# Patient Record
Sex: Female | Born: 1995 | Race: White | Hispanic: No | Marital: Single | Smoking: Current every day smoker
Health system: Southern US, Community
[De-identification: ages and names within clinical notes are randomized; demographics above are authoritative.]

## PROBLEM LIST (undated history)

## (undated) DIAGNOSIS — F329 Major depressive disorder, single episode, unspecified: Secondary | ICD-10-CM

## (undated) DIAGNOSIS — F32A Depression, unspecified: Secondary | ICD-10-CM

## (undated) DIAGNOSIS — F419 Anxiety disorder, unspecified: Secondary | ICD-10-CM

## (undated) HISTORY — DX: Major depressive disorder, single episode, unspecified: F32.9

## (undated) HISTORY — DX: Anxiety disorder, unspecified: F41.9

## (undated) HISTORY — DX: Depression, unspecified: F32.A

---

## 2014-01-03 ENCOUNTER — Encounter (HOSPITAL_COMMUNITY): Payer: Self-pay | Admitting: Professional Counselor

## 2014-01-03 ENCOUNTER — Ambulatory Visit (INDEPENDENT_AMBULATORY_CARE_PROVIDER_SITE_OTHER): Payer: BC Managed Care – PPO | Admitting: Professional Counselor

## 2014-01-03 ENCOUNTER — Encounter (INDEPENDENT_AMBULATORY_CARE_PROVIDER_SITE_OTHER): Payer: Self-pay

## 2014-01-03 DIAGNOSIS — F411 Generalized anxiety disorder: Secondary | ICD-10-CM | POA: Insufficient documentation

## 2014-01-03 DIAGNOSIS — F331 Major depressive disorder, recurrent, moderate: Secondary | ICD-10-CM

## 2014-01-03 NOTE — Progress Notes (Signed)
Patient:   Natalie Kirk   DOB:   Mar 05, 1996  MR Number:  [id number]  [facility]  [facility] [address] [place] [address] Dept: [phone]           Date of Service:   01/03/2014   Start Time:   9:00am End Time:   10:00am  Provider/Observer:  Raye SorrowHannah N Raeden Schippers Clinical Social Work       Billing Code/Service: [phone]  Chief Complaint:     Chief Complaint  Patient presents with  . Establish Care  . Anxiety  . Depression    Reason for Service:  Establish Care and being individual counseling   Current Status:  Patient reports she has been feeling more anxious and depressed over the last 2 years with main stressors being work, school, and social anxiety amongst people.  Reliability of Information: Patient was present and reported through self report. Patient father also attended session and reported additional information.    Referral Source:  PCP: Dr. Jeffie PollockJudge   Behavioral Observation: Natalie BignessChelsea Jost  presents as a 18 y.o.-year-old Right Caucasian Female who appeared her stated age. her dress was Appropriate and she was Fairly Groomed and her manners were Appropriate to the situation.  There were not any physical disabilities noted.  she displayed an appropriate level of cooperation and motivation.    Interactions:    Active   Attention:   within normal limits  Memory:   within normal limits  Visuo-spatial:   within normal limits  Speech (Volume):  low  Speech:   soft  Thought Process:  Coherent  Though Content:  WNL  Orientation:   person, place, time/date and situation  Judgment:   Good  Planning:   Poor  Affect:    Anxious  Mood:    Anxious and Depressed  Insight:   Fair  Intelligence:   normal  Marital Status/Living: Patient reports she is currently living half the time with her parents and the other time with her boyfriend. She reports she has been seeing him for the  last 7 months. 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  Substance Use:  No concerns of substance abuse are reported.  No previous history of substance abuse or treatment.  Education:   College  Patient reports she completed high school at [employer], went to [employer] for a semester but was unsuccessful due to transition and anxiety.  Patient currently enrolled at [employer] and is a Medical laboratory scientific officerophomore.   Military History: None  Strengths:  Independent, driven, hardworking, and caring  Weaknesses:  Communication, anger, confidence and self esteem.  Religious Affiliation:  None currently.  Hobbies/Interests:  Working at her job, reading, watching TV, being with her boyfriend or friend.  Patient has never been part of clubs, sporting activities.  Medical History:   Past Medical History  Diagnosis Date  . Anxiety   . Depression         No outpatient encounter prescriptions on file as of 01/03/2014.          Sexual History:   History  Sexual Activity  . Sexual Activity: Not on file    Abuse/Trauma History: Patient reports no history of sexual, emotional, or physical abuse. Reports no past traumatic situations.  Psychiatric History:  This is patient's first formal appointment for therapy and patient has been prescribed Zoloft from PCP  in the past year. Patient reports she had a negative response to the medication as it made her very sleepy and depressed.  Patient was switched to Klonopin back in February of 2015 but only given 10 pills and currently out of medication.  Patient was referred to Psychiatrist for medication management.  Family Med/Psych History:  Family History  Problem Relation Age of Onset  . Anxiety disorder Maternal Aunt   . Depression Maternal Aunt   . Alcohol abuse Cousin   . Drug abuse Cousin     Risk of Suicide/Violence: virtually non-existent Patient reports no SI, plans, or  intent. Father concurred with report and reports all weapons (guns) are locked away with ammunition also locked in a different place. No past history of SI plans or intent.  Impression/DX:  Hallie arrives to her first therapy session with her father to establish care. Patient has never had any formalized therapy in past and arrives very anxious and nervous.  She reports her biggest stressors are her anxiety and depression which have increased and been present in the last 2 years.  She reports transition from high school to college has been difficult with friends separating and losing contact.  She reports socially she struggles in groups of more the 3-4 and when she feels overwhelmed and upset she will become angry and impulsive.  Patient reports her depression has also been problematic because she has been unable to make new friends, engaged in her social life, and isolates at home or alone. She reports she has one best friend and a boyfriend as supports along with her father. She reports her father has always been there for her since childhood as mother was always working and emotionally unavailable.  Patient reports she has panic attacks 2-3 times a week which is a decrease causing her chest pain, chills, tunnel vision, and anger/outbursts.  She reports she is tired of feeling alone and worrying all the time about work, school, friends, and life.  She will benefit from individual counseling utilizing CBT, assertiveness training, and DBT skills.  Clinically patient is observed to be very anxious and interested discussing and learning about anxiety and how it affects her on a daily basis.  She was also referred for a medication appointment per her asking and part of her treatment plan.   Disposition/Plan:  Patient will begin therapy with LCSW every week for anxiety and depression/unspecifided mood disorder.  Patient was also referred to Psychiatrist for medication management to help alleviate anxiety  symptoms.  Diagnosis:    Axis I:  Major Depression, single episode, moderate      Generalized anxiety, R/o panic disorder or social anxiety disorder     Axis II: Deferred                Lashayla Armes, Evlyn Courier, LCSW

## 2014-01-10 ENCOUNTER — Encounter (HOSPITAL_COMMUNITY): Payer: Self-pay | Admitting: Physician Assistant

## 2014-01-10 ENCOUNTER — Ambulatory Visit (INDEPENDENT_AMBULATORY_CARE_PROVIDER_SITE_OTHER): Payer: BC Managed Care – PPO | Admitting: Physician Assistant

## 2014-01-10 VITALS — BP 106/63 | HR 84 | Ht 64.0 in | Wt 144.0 lb

## 2014-01-10 DIAGNOSIS — F411 Generalized anxiety disorder: Secondary | ICD-10-CM

## 2014-01-10 DIAGNOSIS — F331 Major depressive disorder, recurrent, moderate: Secondary | ICD-10-CM

## 2014-01-10 MED ORDER — HYDROXYZINE HCL 10 MG PO TABS: 10.0000 mg | ORAL_TABLET | Freq: Three times a day (TID) | ORAL | Status: DC | PRN

## 2014-01-10 MED ORDER — BUSPIRONE HCL 10 MG PO TABS: ORAL_TABLET | ORAL | Status: DC

## 2014-01-10 NOTE — Progress Notes (Signed)
    [facility] Follow-up Outpatient Visit  Garth BignessChelsea Kirk 02/08/1996  Date: 01/10/2014    Subjective: Natalie BockChelsea is in with her father today to discuss medication for her anxiety and depression which seems out of control for her.   Filed Vitals:   01/10/14 1510  BP: 106/63  Pulse: 84    Mental Status Examination  Appearance: WDWN WF NAD Alert: Yes Attention: good  Cooperative: Yes Eye Contact: Good Speech: soft, clear goal directed Psychomotor Activity: Normal Memory/Concentration: 3/3 at 1 minute and 3/3 at 5 minutes Oriented: situation, day of week and month of year Mood: Anxious and Depressed  Anxiety 7/10, Depression 3/10 Affect: Congruent Thought Processes and Associations: Coherent, Goal Directed, Linear and Logical Fund of Knowledge: Good Thought Content: WNL Insight: Good Judgement: Good Zung Anxiety: 59/100- upper limits of moderate to severe anxiety Zung Depression: 45/80= mild depression. Diagnosis: GAD Moderate                     Depression mild  Treatment Plan:  1. Medication management. 2. Out patient therapy for CBT. 3. Follow up in 2 weeks. 4. Avoid alcohol and drugs. 5.Routine schedule for sleep.   Rona RavensNeil T. Prabhleen Montemayor RPAC 3:40 PM 01/10/2014

## 2014-01-10 NOTE — Patient Instructions (Signed)
1. Set boundaries with family to guard against explosions. 2. Ask for time outs when needed. 3. Communicate with family need to work on school work with out interruption. 4. Avoid all alcohol and drugs. 5. Practice self soothing techniques. 6. Follow up 2 weeks.

## 2014-01-18 ENCOUNTER — Ambulatory Visit (INDEPENDENT_AMBULATORY_CARE_PROVIDER_SITE_OTHER): Payer: BC Managed Care – PPO | Admitting: Professional Counselor

## 2014-01-18 DIAGNOSIS — F411 Generalized anxiety disorder: Secondary | ICD-10-CM

## 2014-01-18 NOTE — Progress Notes (Signed)
   THERAPIST PROGRESS NOTE  Session Time: 2:00-2:40pm  Participation Level: Active  Behavioral Response: CasualAlertAnxious  Type of Therapy: Individual Therapy  Treatment Goals addressed: Anxiety and Communication: within relationships  Interventions: Motivational Interviewing and Assertiveness Training  Summary: Natalie BignessChelsea Kirk is a 18 y.o. female who presents with  Her father again for session. Patient has constrict affect and anxious mood. She sits on the edge of her chair and struggles finding her words to express self to LCSW.  Session focused on completing treatment plan, setting personal goals for treatment plan and identifying stressors within communication and anxiety.  Patient reports over the last few weeks she has been less anxious due to doing more activities and being more social able. She reports she has not been taking her medication consistently and LCSW provided education as to why medication is important. Patient is able to process feelings related to signing up for classes for college and learn coping skills in efforts to decrease procrastination such as a planner, calendar, and support from father.  Father reports no recent stressors.   Suicidal/Homicidal: Nowithout intent/plan  Therapist Response: Patient remains guarded with information she shares and this could be because she will not come back to therapist office without her father. Patient looks for assurance from father when asked questions, struggles making decisions for self showing lack of control or past history of someone controlling her. Patient shows little progress with accountability and identifying reasons for therapy at this time. She is not taking her medication as directed, thus unknown outcomes. Patient has been given homework to begin journaling thought patterns, situations, feelings, and outcomes. Patient also given reading material about how to analyze thinking  In efforts to uncover unhelpful  thoughts.  Patient must show more motivation towards therapy and is encouraged to come back next session without father.  Plan: Return again in 3 weeks.  Diagnosis: Axis I: Generalized Anxiety Disorder    Axis II: Deferred    Raye SorrowCoble, Rinaldo Macqueen N, LCSW 01/18/2014

## 2014-01-23 ENCOUNTER — Encounter (HOSPITAL_COMMUNITY): Payer: Self-pay | Admitting: Professional Counselor

## 2014-01-24 ENCOUNTER — Encounter (HOSPITAL_COMMUNITY): Payer: Self-pay | Admitting: Physician Assistant

## 2014-01-24 ENCOUNTER — Ambulatory Visit (INDEPENDENT_AMBULATORY_CARE_PROVIDER_SITE_OTHER): Payer: BC Managed Care – PPO | Admitting: Physician Assistant

## 2014-01-24 VITALS — BP 125/74 | HR 82 | Ht 64.0 in | Wt 141.5 lb

## 2014-01-24 DIAGNOSIS — F331 Major depressive disorder, recurrent, moderate: Secondary | ICD-10-CM

## 2014-01-24 DIAGNOSIS — F411 Generalized anxiety disorder: Secondary | ICD-10-CM

## 2014-01-24 NOTE — Patient Instructions (Signed)
1. Patient to take 1/2 of 10mg  buspar two times a day starting tonight with a light snack. 2. Call the office if she continues to have nausea and vomiting, dizziness. 3. Follow up in 3 weeks.

## 2014-01-24 NOTE — Progress Notes (Signed)
   [facility] Follow-up Outpatient Visit  Natalie BignessChelsea Kirk [birth year]  Date: 01/24/2014    Subjective:  Patient is in to follow up on her starting Buspar. She took 10mg  and had significant nausea and vomiting, so she did not take any more. She has just recently returned from the beach, and states she is using less THC than before.     Had a dramatic morning due to argument with Ex BF and then missed her appointment after locking her keys in the car. Filed Vitals:   01/24/14 1530  BP: 125/74  Pulse: 82    Mental Status Examination  Appearance:  Neat and clean Alert: Yes Attention: good  Cooperative: Yes Eye Contact: Good Speech: clear and goal directed Psychomotor Activity: Normal Memory/Concentration: fair Oriented: time/date, day of week and month of year Mood: Anxious and Depressed today she rates her anxiety as a 6-7/10 related to an argument with her ex boy friend, she also locked her keys in the car this AM as well. Affect: Appropriate and Congruent Thought Processes and Associations: Coherent, Goal Directed and Intact Fund of Knowledge: Good Thought Content: WNL Insight: Fair Judgement: Fair  Diagnosis: GAD  Treatment Plan:  1. Patient will take 5mg  of Buspar BID with a light snack and see how this works for her. She will follow up as planned. 2. 3 weeks. Rona RavensNeil T. Angelly Spearing RPAC 3:42 PM 01/24/2014

## 2014-02-08 ENCOUNTER — Ambulatory Visit (HOSPITAL_COMMUNITY): Payer: PRIVATE HEALTH INSURANCE | Admitting: Professional Counselor

## 2014-02-12 ENCOUNTER — Ambulatory Visit (HOSPITAL_COMMUNITY): Payer: PRIVATE HEALTH INSURANCE | Admitting: Professional Counselor

## 2014-02-14 ENCOUNTER — Ambulatory Visit (HOSPITAL_COMMUNITY): Payer: PRIVATE HEALTH INSURANCE | Admitting: Physician Assistant

## 2015-01-06 ENCOUNTER — Emergency Department (INDEPENDENT_AMBULATORY_CARE_PROVIDER_SITE_OTHER): Payer: BLUE CROSS/BLUE SHIELD

## 2015-01-06 ENCOUNTER — Encounter: Payer: Self-pay | Admitting: Emergency Medicine

## 2015-01-06 ENCOUNTER — Emergency Department (INDEPENDENT_AMBULATORY_CARE_PROVIDER_SITE_OTHER)
Admission: EM | Admit: 2015-01-06 | Discharge: 2015-01-06 | Disposition: A | Discharge status: Home / Self Care | Payer: BLUE CROSS/BLUE SHIELD | Source: Home / Self Care | Attending: Family Medicine | Admitting: Family Medicine

## 2015-01-06 DIAGNOSIS — S52602A Unspecified fracture of lower end of left ulna, initial encounter for closed fracture: Secondary | ICD-10-CM | POA: Diagnosis not present

## 2015-01-06 DIAGNOSIS — S52502A Unspecified fracture of the lower end of left radius, initial encounter for closed fracture: Secondary | ICD-10-CM

## 2015-01-06 DIAGNOSIS — W010XXA Fall on same level from slipping, tripping and stumbling without subsequent striking against object, initial encounter: Secondary | ICD-10-CM

## 2015-01-06 DIAGNOSIS — S52592A Other fractures of lower end of left radius, initial encounter for closed fracture: Secondary | ICD-10-CM | POA: Diagnosis not present

## 2015-01-06 DIAGNOSIS — S52612A Displaced fracture of left ulna styloid process, initial encounter for closed fracture: Secondary | ICD-10-CM

## 2015-01-06 DIAGNOSIS — W1839XA Other fall on same level, initial encounter: Secondary | ICD-10-CM

## 2015-01-06 DIAGNOSIS — X58XXXA Exposure to other specified factors, initial encounter: Secondary | ICD-10-CM

## 2015-01-06 MED ORDER — HYDROXYZINE HCL 25 MG PO TABS: 25.0000 mg | ORAL_TABLET | Freq: Three times a day (TID) | ORAL | Status: DC | PRN

## 2015-01-06 MED ORDER — HYDROCODONE-ACETAMINOPHEN 10-325 MG PO TABS: 1.0000 | ORAL_TABLET | Freq: Three times a day (TID) | ORAL | Status: DC | PRN

## 2015-01-06 NOTE — ED Notes (Signed)
[place]; hurt left wrist and pain radiates to hand and lower forearm. Put ice on area; no OTCs.

## 2015-01-06 NOTE — Consult Note (Addendum)
   Subjective:    I'm seeing this patient as a consultation for:  Junius FinnerErin O'Malley PA-C  CC: Left distal radius fracture  HPI: [place] [place], backwards onto her left wrist, had immediate pain, swelling, and inability to use the wrist. She was seen in urgent care where x-rays showed a fracture through the distal radius and almost, pain is severe, persistent without radiation. I was called for further evaluation and definitive treatment.  Past medical history, Surgical history, Family history not pertinant except as noted below, Social history, Allergies, and medications have been entered into the medical record, reviewed, and no changes needed.   Review of Systems: No headache, visual changes, nausea, vomiting, diarrhea, constipation, dizziness, abdominal pain, skin rash, fevers, chills, night sweats, weight loss, swollen lymph nodes, body aches, joint swelling, muscle aches, chest pain, shortness of breath, mood changes, visual or auditory hallucinations.   Objective:   General: Well Developed, well nourished, and in no acute distress.  Neuro/Psych: Alert and oriented x3, extra-ocular muscles intact, able to move all 4 extremities, sensation grossly intact. Skin: Warm and dry, no rashes noted.  Respiratory: Not using accessory muscles, speaking in full sentences, trachea midline.  Cardiovascular: Pulses palpable, no extremity edema. Abdomen: Does not appear distended. Left wrist: Swollen, especially tender to palpation, neurovascular intact with good motion of the fingers, wrist motion is limited by pain. Tender at the distal radius as well as over the ulnar styloid process.  X-rays reviewed and show a nondisplaced, non-angulated transverse distal radius fracture, extra-articular as well as a small avulsion from the ulnar styloid.  Sugar tong splint placed  Impression and Recommendations:   This case required medical decision making of moderate  complexity.  1. Distal radius fracture and ulnar styloid fracture Sugar tong splint, hydrocodone for pain, return to see me Thursday or Friday of this week.  I billed a fracture code for this encounter, all subsequent visits will be post-op checks in the global period.  ___________________________________________ Ihor Austinhomas J. Benjamin Stainhekkekandam, M.D., ABFM., CAQSM. Primary Care and Sports Medicine [facility]  Adjunct Instructor of Family Medicine  [employer]

## 2015-01-06 NOTE — Discharge Instructions (Signed)
Please take pain medication as prescribed by Dr. Benjamin Stain.  Vicodin is a narcotic pain medication, do not combine these medications with others containing tylenol. While taking, do not drink alcohol, drive, or perform any other activities that requires focus while taking these medications.  Be sure to schedule a follow-up appointment with Dr. Benjamin Stain for Thursday or Friday of this week.  Please arrive 15 minutes prior to appointment so that you can go downstairs and get an x-ray of your left wrist.   Cast or Splint Care Casts and splints support injured limbs and keep bones from moving while they heal. It is important to care for your cast or splint at home.  HOME CARE INSTRUCTIONS  Keep the cast or splint uncovered during the drying period. It can take 24 to 48 hours to dry if it is made of plaster. A fiberglass cast will dry in less than 1 hour.  Do not rest the cast on anything harder than a pillow for the first 24 hours.  Do not put weight on your injured limb or apply pressure to the cast until your health care provider gives you permission.  Keep the cast or splint dry. Wet casts or splints can lose their shape and may not support the limb as well. A wet cast that has lost its shape can also create harmful pressure on your skin when it dries. Also, wet skin can become infected.  Cover the cast or splint with a plastic bag when bathing or when out in the rain or snow. If the cast is on the trunk of the body, take sponge baths until the cast is removed.  If your cast does become wet, dry it with a towel or a blow dryer on the cool setting only.  Keep your cast or splint clean. Soiled casts may be wiped with a moistened cloth.  Do not place any hard or soft foreign objects under your cast or splint, such as cotton, toilet paper, lotion, or powder.  Do not try to scratch the skin under the cast with any object. The object could get stuck inside the cast. Also, scratching could  lead to an infection. If itching is a problem, use a blow dryer on a cool setting to relieve discomfort.  Do not trim or cut your cast or remove padding from inside of it.  Exercise all joints next to the injury that are not immobilized by the cast or splint. For example, if you have a long leg cast, exercise the hip joint and toes. If you have an arm cast or splint, exercise the shoulder, elbow, thumb, and fingers.  Elevate your injured arm or leg on 1 or 2 pillows for the first 1 to 3 days to decrease swelling and pain.It is best if you can comfortably elevate your cast so it is higher than your heart. SEEK MEDICAL CARE IF:   Your cast or splint cracks.  Your cast or splint is too tight or too loose.  You have unbearable itching inside the cast.  Your cast becomes wet or develops a soft spot or area.  You have a bad smell coming from inside your cast.  You get an object stuck under your cast.  Your skin around the cast becomes red or raw.  You have new pain or worsening pain after the cast has been applied. SEEK IMMEDIATE MEDICAL CARE IF:   You have fluid leaking through the cast.  You are unable to move your fingers or toes.  You have discolored (blue or white), cool, painful, or very swollen fingers or toes beyond the cast.  You have tingling or numbness around the injured area.  You have severe pain or pressure under the cast.  You have any difficulty with your breathing or have shortness of breath.  You have chest pain. Document Released: 06/11/2000 Document Revised: 04/04/2013 Document Reviewed: 12/21/2012 [facility] Patient Information 2015 [employer] [place]. This information is not intended to replace advice given to you by your health care provider. Make sure you discuss any questions you have with your health care provider.

## 2015-01-06 NOTE — ED Provider Notes (Signed)
CSN: [id number]     Arrival date & time 01/06/15  1635 History   First MD Initiated Contact with Patient 01/06/15 1647     Chief Complaint  Patient presents with  . Wrist Pain   (Consider location/radiation/quality/duration/timing/severity/associated sxs/prior Treatment) HPI Patient is a 19 year old female presenting to urgent care with complaint of sudden onset left wrist pain that started about 1 hour prior to arrival.  [place], landing backwards on her left hand.  Patient notes swelling and slight deformity of her left wrist.  States severe pain with any light touch or movement of her wrist or hand.  Pain is aching and throbbing.  She has not taken any pain medication prior to arrival. Denies numbness or tingling in fingers.  Denies elbow or shoulder pain.  Denies any other injuries.  Denies prior fractures or surgeries to left wrist.  Patient is right-hand dominant.  Past Medical History  Diagnosis Date  . Anxiety   . Depression    History reviewed. No pertinent past surgical history. Family History  Problem Relation Age of Onset  . Anxiety disorder Maternal Aunt   . Depression Maternal Aunt   . Alcohol abuse Cousin   . Drug abuse Cousin    History  Substance Use Topics  . Smoking status: Current Every Day Smoker -- 0.40 packs/day    Types: Cigarettes  . Smokeless tobacco: Never Used  . Alcohol Use: Yes     Comment: 1 x a month   OB History    No data available     Review of Systems  Musculoskeletal: Positive for myalgias, joint swelling and arthralgias.       Left wrist  Skin: Negative for color change and wound.  Neurological: Positive for weakness (Left hand and wrist due to severe pain). Negative for numbness.    Allergies  Review of patient's allergies indicates no known allergies.  Home Medications   Prior to Admission medications   Medication Sig Start Date End Date Taking? Authorizing Provider   busPIRone (BUSPAR) 10 MG tablet Take one tablet morning and evening for anxiety. 01/10/14   Tamala Julian, PA-C  HYDROcodone-acetaminophen (NORCO) 10-325 MG per tablet Take 1 tablet by mouth every 8 (eight) hours as needed. 01/06/15   Monica Becton, MD  hydrOXYzine (ATARAX/VISTARIL) 10 MG tablet Take 1 tablet (10 mg total) by mouth every 8 (eight) hours as needed. 01/10/14   Tamala Julian, PA-C  hydrOXYzine (ATARAX/VISTARIL) 25 MG tablet Take 1 tablet (25 mg total) by mouth every 8 (eight) hours as needed. 01/06/15   Junius Finner, PA-C  TRI-SPRINTEC 0.18/0.215/0.25 MG-35 MCG tablet  01/14/14   Historical Provider, MD   BP 111/71 mmHg  Pulse 88  Temp(Src) 97.6 F (36.4 C) (Oral)  Resp 16  Ht  (1.626 m)  Wt 140 lb (63.504 kg)  BMI 24.02 kg/m2  SpO2 97%  LMP 12/16/2014 (Approximate) Physical Exam  Constitutional: She is oriented to person, place, and time. She appears well-developed and well-nourished.  HENT:  Head: Normocephalic and atraumatic.  Eyes: EOM are normal.  Neck: Normal range of motion.  Cardiovascular: Normal rate.   Pulses:      Radial pulses are 2+ on the left side.  Left hand: Cap refill <3 seconds  Pulmonary/Chest: Effort normal.  Musculoskeletal: She exhibits edema and tenderness.  Deformity of Left wrist on dorsal aspect. Significant tenderness with light touch and any movement.  Pt is  able to wiggle all 5 fingers, pain radiates into wrist.  Difficulty isolating snuff box given severe pain to wrist. Left elbow and shoulder: non-tender.   Neurological: She is alert and oriented to person, place, and time.  Left hand: sensation in tact, symmetric compared to Right hand.  Skin: Skin is warm and dry. No erythema.  Left wrist and hand: Hematoma to dorsal aspect of left wrist, skin in tact. No ecchymosis or erythema.  Psychiatric: She has a normal mood and affect. Her behavior is normal.  Nursing note and vitals reviewed.   ED Course  Procedures  (including critical care time) Labs Review Labs Reviewed - No data to display  Imaging Review Dg Wrist Complete Left  01/06/2015   CLINICAL DATA:  19 year old female status post fall and left wrist injury.  EXAM: LEFT WRIST - COMPLETE 3+ VIEW  COMPARISON:  None.  FINDINGS: There is an impacted appearing transverse fracture of the distal radius. There is a chip fracture the tip of the ulnar styloid. The remainder of the visualized osseous structures appear unremarkable. There is soft tissue swelling of the wrist. A  IMPRESSION: Transverse fracture of the distal radius as well as chip fracture of the ulna styloid.   Electronically Signed   By: Elgie CollardArash  Radparvar M.D.   On: 01/06/2015 17:15     MDM   1. Distal radius fracture, left, closed, initial encounter   2. Ulna styloid fracture, closed, left, initial encounter   3. Fall from slip, trip, or stumble, initial encounter     Patient is a 19 year old female presenting to urgent care with left wrist pain and swelling.  This started 1 hour prior to arrival after slip and fall. No other injuries.  Left hand is neurovascularly intact.  Plain films: significant for distal radius fracture as well as chip fracture if ulnar styloid. Consulted with Dr. Benjamin Stainhekkekandam who also evaluated pt and placed a sugar-tong splint on pt, and provided a sling.  Refer to his consult note.  Pt advised to call to schedule f/u appointment for Thursday or Friday (July 14 or 15th) for recheck of Left wrist fracture.  Rx: vicodin for pain and vistaril for itch as pt has hx of eczema.  Home care instructions provided. Patient and family verbalized understanding and agreement with treatment plan.   Junius Finnerrin O'Malley, PA-C 01/06/15 1805

## 2015-01-07 ENCOUNTER — Telehealth: Payer: Self-pay | Admitting: Sports Medicine

## 2015-01-07 ENCOUNTER — Other Ambulatory Visit: Payer: Self-pay | Admitting: Sports Medicine

## 2015-01-07 DIAGNOSIS — S52502A Unspecified fracture of the lower end of left radius, initial encounter for closed fracture: Secondary | ICD-10-CM | POA: Insufficient documentation

## 2015-01-07 DIAGNOSIS — S52602A Unspecified fracture of lower end of left ulna, initial encounter for closed fracture: Principal | ICD-10-CM

## 2015-01-07 NOTE — Telephone Encounter (Signed)
Patient called clinic to state her fingers were really swollen since her wrist fracture. Inquired if she has been keeping her arm elevated, Pt states "sometimes." Stressed the importance of keeping her arm elevated above the heart and advised she can use pillows to prop her arm on. Pt reports no change in color of fingers, no change in temperature (Pt had her arm under the blanket when she check to see if her fingers were cold to touch), and Pt is able to move all fingers. Advised Pt can apply an ice pack for 15min at a time and keep the arm elevated to help with the swelling. Pt advised if she starts to notice a change in color, temperature, or movement to go back to UC. Advised if she tried ice and elevation and has no change by this afternoon to contact us and we can change her appt or recommend UC based on her symptoms at that time. Pt verbalized understanding, no further questions.

## 2015-01-09 ENCOUNTER — Ambulatory Visit (INDEPENDENT_AMBULATORY_CARE_PROVIDER_SITE_OTHER): Payer: BLUE CROSS/BLUE SHIELD | Admitting: Sports Medicine

## 2015-01-09 ENCOUNTER — Encounter: Payer: Self-pay | Admitting: Sports Medicine

## 2015-01-09 ENCOUNTER — Ambulatory Visit (INDEPENDENT_AMBULATORY_CARE_PROVIDER_SITE_OTHER): Payer: BLUE CROSS/BLUE SHIELD

## 2015-01-09 DIAGNOSIS — S52502A Unspecified fracture of the lower end of left radius, initial encounter for closed fracture: Secondary | ICD-10-CM

## 2015-01-09 DIAGNOSIS — X58XXXD Exposure to other specified factors, subsequent encounter: Secondary | ICD-10-CM | POA: Diagnosis not present

## 2015-01-09 DIAGNOSIS — S52592D Other fractures of lower end of left radius, subsequent encounter for closed fracture with routine healing: Secondary | ICD-10-CM

## 2015-01-09 DIAGNOSIS — S52602D Unspecified fracture of lower end of left ulna, subsequent encounter for closed fracture with routine healing: Secondary | ICD-10-CM

## 2015-01-09 DIAGNOSIS — S52612D Displaced fracture of left ulna styloid process, subsequent encounter for closed fracture with routine healing: Secondary | ICD-10-CM | POA: Diagnosis not present

## 2015-01-09 DIAGNOSIS — S52602A Unspecified fracture of lower end of left ulna, initial encounter for closed fracture: Principal | ICD-10-CM

## 2015-01-09 DIAGNOSIS — S52502D Unspecified fracture of the lower end of left radius, subsequent encounter for closed fracture with routine healing: Secondary | ICD-10-CM

## 2015-01-09 MED ORDER — ONDANSETRON 8 MG PO TBDP: 8.0000 mg | ORAL_TABLET | Freq: Three times a day (TID) | ORAL | Status: DC | PRN

## 2015-01-09 NOTE — Progress Notes (Signed)
  Subjective: 4 days post distal radius fracture on the left as well as ulnar styloid fracture, doing well in a sugar tong splint. She did have a bit of nausea and emesis with hydrocodone.   Objective: General: Well-developed, well-nourished, and in no acute distress. Left wrist: Splint is in good shape, neurovascular intact distally, still somewhat swollen.  X-rays reviewed, good anatomic alignment of the fracture, no shift, good radial height, inclination, no fracture into the joint.  Assessment/plan:

## 2015-01-09 NOTE — Assessment & Plan Note (Signed)
Doing well post fracture. X-rays look good.  Return at the end of next week, we will probably place a cast at that time, x-ray before visit. I am going to give her some Zofran to take with the narcotics.

## 2015-01-16 ENCOUNTER — Encounter: Payer: Self-pay | Admitting: Sports Medicine

## 2015-01-16 ENCOUNTER — Ambulatory Visit (INDEPENDENT_AMBULATORY_CARE_PROVIDER_SITE_OTHER): Payer: BLUE CROSS/BLUE SHIELD | Admitting: Sports Medicine

## 2015-01-16 ENCOUNTER — Ambulatory Visit (INDEPENDENT_AMBULATORY_CARE_PROVIDER_SITE_OTHER): Payer: BLUE CROSS/BLUE SHIELD

## 2015-01-16 VITALS — BP 108/72 | HR 72 | Ht 64.0 in | Wt 143.0 lb

## 2015-01-16 DIAGNOSIS — S52502D Unspecified fracture of the lower end of left radius, subsequent encounter for closed fracture with routine healing: Secondary | ICD-10-CM | POA: Diagnosis not present

## 2015-01-16 DIAGNOSIS — S52602D Unspecified fracture of lower end of left ulna, subsequent encounter for closed fracture with routine healing: Secondary | ICD-10-CM | POA: Diagnosis not present

## 2015-01-16 DIAGNOSIS — X58XXXD Exposure to other specified factors, subsequent encounter: Secondary | ICD-10-CM | POA: Diagnosis not present

## 2015-01-16 DIAGNOSIS — L309 Dermatitis, unspecified: Secondary | ICD-10-CM | POA: Insufficient documentation

## 2015-01-16 MED ORDER — TRIAMCINOLONE ACETONIDE 0.5 % EX CREA: 1.0000 "application " | TOPICAL_CREAM | Freq: Two times a day (BID) | CUTANEOUS | Status: DC

## 2015-01-16 NOTE — Progress Notes (Signed)
  Subjective: 1 week post distal radius and ulnar styloid process fractures, sugar tong splint has been in place, getting some itch with hydrocodone.   Objective: General: Well-developed, well-nourished, and in no acute distress. Left wrist: Splint is removed, still tender over the fracture minimally, still has some swelling of the fourth extensor compartment, neurovascularly intact distally.  Short arm cast placed  X-rays reviewed, good stable position of the distal radius fracture with maintenance of the radial length and inclination. Ulnar styloid process fracture is not visible.  Assessment/plan:

## 2015-01-16 NOTE — Assessment & Plan Note (Signed)
1 week post fracture, short arm cast placed today. Return in 3 weeks. No x-rays needed.

## 2015-01-16 NOTE — Assessment & Plan Note (Signed)
Triamcinolone cream.

## 2015-02-06 ENCOUNTER — Ambulatory Visit (INDEPENDENT_AMBULATORY_CARE_PROVIDER_SITE_OTHER): Payer: BLUE CROSS/BLUE SHIELD | Admitting: Sports Medicine

## 2015-02-06 ENCOUNTER — Ambulatory Visit (INDEPENDENT_AMBULATORY_CARE_PROVIDER_SITE_OTHER): Payer: BLUE CROSS/BLUE SHIELD

## 2015-02-06 ENCOUNTER — Encounter: Payer: Self-pay | Admitting: Sports Medicine

## 2015-02-06 DIAGNOSIS — S52502D Unspecified fracture of the lower end of left radius, subsequent encounter for closed fracture with routine healing: Secondary | ICD-10-CM

## 2015-02-06 DIAGNOSIS — S52602D Unspecified fracture of lower end of left ulna, subsequent encounter for closed fracture with routine healing: Principal | ICD-10-CM

## 2015-02-06 DIAGNOSIS — X58XXXD Exposure to other specified factors, subsequent encounter: Secondary | ICD-10-CM

## 2015-02-06 DIAGNOSIS — S52612D Displaced fracture of left ulna styloid process, subsequent encounter for closed fracture with routine healing: Secondary | ICD-10-CM

## 2015-02-06 NOTE — Assessment & Plan Note (Signed)
Doing well 3 weeks post distal radius and ulnar styloid process fracture. Transition into a Velcro brace, return in 3 weeks, x-ray today.

## 2015-02-06 NOTE — Progress Notes (Signed)
  Subjective: 3 weeks post distal radius and ulnar styloid process fracture, doing extremely well after 3 weeks of cast immobilization.   Objective: General: Well-developed, well-nourished, and in no acute distress. Left wrist: Cast is removed, no tenderness at the fracture site, good motion, neurovascular intact distally.  Velcro brace applied. Assessment/plan:

## 2015-02-27 ENCOUNTER — Ambulatory Visit: Payer: Self-pay | Admitting: Sports Medicine

## 2015-03-04 ENCOUNTER — Encounter: Payer: Self-pay | Admitting: *Deleted

## 2015-03-04 ENCOUNTER — Emergency Department (INDEPENDENT_AMBULATORY_CARE_PROVIDER_SITE_OTHER)
Admission: EM | Admit: 2015-03-04 | Discharge: 2015-03-04 | Disposition: A | Discharge status: Home / Self Care | Payer: BLUE CROSS/BLUE SHIELD | Source: Home / Self Care | Attending: Family Medicine | Admitting: Family Medicine

## 2015-03-04 DIAGNOSIS — F439 Reaction to severe stress, unspecified: Secondary | ICD-10-CM

## 2015-03-04 DIAGNOSIS — B029 Zoster without complications: Secondary | ICD-10-CM

## 2015-03-04 DIAGNOSIS — Z658 Other specified problems related to psychosocial circumstances: Secondary | ICD-10-CM | POA: Diagnosis not present

## 2015-03-04 MED ORDER — OXYCODONE-ACETAMINOPHEN 5-325 MG PO TABS: 1.0000 | ORAL_TABLET | Freq: Four times a day (QID) | ORAL | Status: DC | PRN

## 2015-03-04 MED ORDER — VALACYCLOVIR HCL 1 G PO TABS: 1000.0000 mg | ORAL_TABLET | Freq: Three times a day (TID) | ORAL | Status: DC

## 2015-03-04 MED ORDER — IBUPROFEN 600 MG PO TABS: 600.0000 mg | ORAL_TABLET | Freq: Four times a day (QID) | ORAL | Status: DC | PRN

## 2015-03-04 MED ORDER — GABAPENTIN 100 MG PO CAPS: 100.0000 mg | ORAL_CAPSULE | Freq: Three times a day (TID) | ORAL | Status: DC

## 2015-03-04 NOTE — ED Notes (Signed)
Pt c/o rash on her back with back pain x 4 days. Denies fever.

## 2015-03-04 NOTE — ED Provider Notes (Signed)
CSN: [id number]     Arrival date & time 03/04/15  1038 History   First MD Initiated Contact with Patient 03/04/15 1051     Chief Complaint  Patient presents with  . Rash   (Consider location/radiation/quality/duration/timing/severity/associated sxs/prior Treatment) HPI Pt is a 19 yo female presenting to [facility] with c/o painful rash on her back that started 4 days ago as well as developing rash on Left side of her chest wall.  Pain is burning and itching, moderate to severe. Worse at night. Pt states she cannot find a comfortable position to lay in due to the pain. Associated body aches, abdominal pain, and nausea.  Pt notes she fractured her Left wrist 2 months ago and still wearing a wrist splint. Pt states this has caused increased stress in her life.  Pt did have chicken pox when she was younger. No sick contacts or recent travel. Reports subjective fever with hot and cold chills. No vomiting or diarrhea. Pt reports she started to develop itching and a rash with hydrocodone she was taking for her wrist fracture.  Past Medical History  Diagnosis Date  . Anxiety   . Depression    History reviewed. No pertinent past surgical history. Family History  Problem Relation Age of Onset  . Anxiety disorder Maternal Aunt   . Depression Maternal Aunt   . Alcohol abuse Cousin   . Drug abuse Cousin   . Hypertension Father   . Hyperlipidemia Father    Social History  Substance Use Topics  . Smoking status: Current Every Day Smoker -- 0.40 packs/day    Types: Cigarettes  . Smokeless tobacco: Never Used  . Alcohol Use: Yes     Comment: 1 x a month   OB History    No data available     Review of Systems  Constitutional: Positive for fever, chills and fatigue.  HENT: Positive for sore throat. Negative for congestion.   Respiratory: Positive for shortness of breath. Negative for cough.   Cardiovascular: Negative for chest pain and palpitations.  Gastrointestinal: Positive for nausea and  abdominal pain. Negative for vomiting and diarrhea.  Musculoskeletal: Positive for myalgias and arthralgias.       Body aches  Skin: Positive for rash ( painful). Negative for color change and wound.  Neurological: Positive for weakness ( generalized) and headaches. Negative for dizziness and light-headedness.    Allergies  Hydrocodone  Home Medications   Prior to Admission medications   Medication Sig Start Date End Date Taking? Authorizing Provider  busPIRone (BUSPAR) 10 MG tablet Take one tablet morning and evening for anxiety. 01/10/14   Tamala Julian, PA-C  gabapentin (NEURONTIN) 100 MG capsule Take 1 capsule (100 mg total) by mouth 3 (three) times daily. 03/04/15   Junius Finner, PA-C  ibuprofen (ADVIL,MOTRIN) 600 MG tablet Take 1 tablet (600 mg total) by mouth every 6 (six) hours as needed. 03/04/15   Junius Finner, PA-C  oxyCODONE-acetaminophen (PERCOCET/ROXICET) 5-325 MG per tablet Take 1 tablet by mouth every 6 (six) hours as needed for moderate pain or severe pain. 03/04/15   Junius Finner, PA-C  TRI-SPRINTEC 0.18/0.215/0.25 MG-35 MCG tablet  01/14/14   Historical Provider, MD  valACYclovir (VALTREX) 1000 MG tablet Take 1 tablet (1,000 mg total) by mouth 3 (three) times daily. 03/04/15   Junius Finner, PA-C   Meds Ordered and Administered this Visit  Medications - No data to display  BP 113/75 mmHg  Pulse 112  Temp(Src) 98.5 F (36.9 C) (Oral)  Resp 16  Ht  (1.626 m)  Wt 135 lb (61.236 kg)  BMI 23.16 kg/m2  SpO2 99%  LMP 02/09/2015 No data found.   Physical Exam  Constitutional: She appears well-developed and well-nourished. No distress.  HENT:  Head: Normocephalic and atraumatic.  Eyes: Conjunctivae are normal. No scleral icterus.  Neck: Normal range of motion. Neck supple.  Cardiovascular: Regular rhythm and normal heart sounds.  Tachycardia present.   mild  Pulmonary/Chest: Effort normal and breath sounds normal. No respiratory distress. She has no wheezes. She  has no rales. She exhibits no tenderness.  Abdominal: Soft. She exhibits no distension and no mass. There is no tenderness. There is no rebound and no guarding.  Soft, non-distended. Mild diffuse tenderness.  Musculoskeletal: Normal range of motion.  Neurological: She is alert.  Skin: Skin is warm and dry. Rash noted. She is not diaphoretic. There is erythema.     Erythematous grouped vesicles in a patch just Left of spine over T4-6 dermatomes. Tender to touch. No bleeding or discharge. Small erythematous vesicular rash on Left lateral chest wall over T5-6 dermatomes. Tender. No bleeding or discharge.  Psychiatric: Her speech is normal. Her mood appears anxious.  Mildly anxious  Nursing note and vitals reviewed.   ED Course  Procedures (including critical care time)  Labs Review Labs Reviewed - No data to display  Imaging Review No results found.     MDM   1. Shingles rash   2. Stress     Pt c/o painful rash on back and chest wall. She has had increased stress due to a recent wrist fracture. Rash c/w shingles. Rx: percocet (advised pt if she develops rash with oxycodone as well, discontinue and notify PCP or UC for change in medication), gabapentin, Valtrex, and ibuprofen. Home care instructions provided.  School note for 2 days provided. Encouraged fluids and rest. Pt education packet on stress management provided to pt. F/u with PCP in 1-2 weeks as shingles rash may need continued care for several weeks. Patient verbalized understanding and agreement with treatment plan.    Junius Finner, PA-C 03/04/15 1114

## 2015-03-04 NOTE — Discharge Instructions (Signed)
Percocet (oxycodone-acetaminophen) is a narcotic pain medication, do not combine these medications with others containing tylenol. While taking, do not drink alcohol, drive, or perform any other activities that requires focus while taking these medications.  ° ° ° °

## 2015-03-09 ENCOUNTER — Telehealth: Payer: Self-pay

## 2015-03-21 ENCOUNTER — Ambulatory Visit (INDEPENDENT_AMBULATORY_CARE_PROVIDER_SITE_OTHER): Payer: BLUE CROSS/BLUE SHIELD | Admitting: Sports Medicine

## 2015-03-21 ENCOUNTER — Encounter: Payer: Self-pay | Admitting: Sports Medicine

## 2015-03-21 VITALS — BP 109/67 | HR 90 | Ht 63.0 in | Wt 138.0 lb

## 2015-03-21 DIAGNOSIS — S52602D Unspecified fracture of lower end of left ulna, subsequent encounter for closed fracture with routine healing: Secondary | ICD-10-CM

## 2015-03-21 DIAGNOSIS — S52502D Unspecified fracture of the lower end of left radius, subsequent encounter for closed fracture with routine healing: Secondary | ICD-10-CM

## 2015-03-21 NOTE — Assessment & Plan Note (Signed)
Doing extremely well, wrist rehabilitation exercises given, return to see me on an as-needed basis if no improvement in range of motion in one month.

## 2015-03-21 NOTE — Progress Notes (Signed)
  Subjective: 8 weeks post closed fracture of the left distal radius and ulnar styloid process, extremely well, she has continued her wrist brace immobilization.   Objective: General: Well-developed, well-nourished, and in no acute distress. Left Wrist: Visibly atrophied, no tenderness to palpation, she does lack some range of motion as expected secondary to prolonged immobilization. Palpation is normal over metacarpals, navicular, lunate, and TFCC; tendons without tenderness/ swelling No snuffbox tenderness. No tenderness over Canal of [place]. Strength 5/5 in all directions without pain. Negative Finkelstein, tinel's and phalens. Negative Watson's test.  Assessment/plan:

## 2017-03-05 IMAGING — CR DG WRIST COMPLETE 3+V*L*
4 series · 4 of 4 positions shown · non-contrast
Comparison: None.

CLINICAL DATA: 19-year-old female status post fall and left wrist
injury.

EXAM:
LEFT WRIST - COMPLETE 3+ VIEW

[wrist pa]
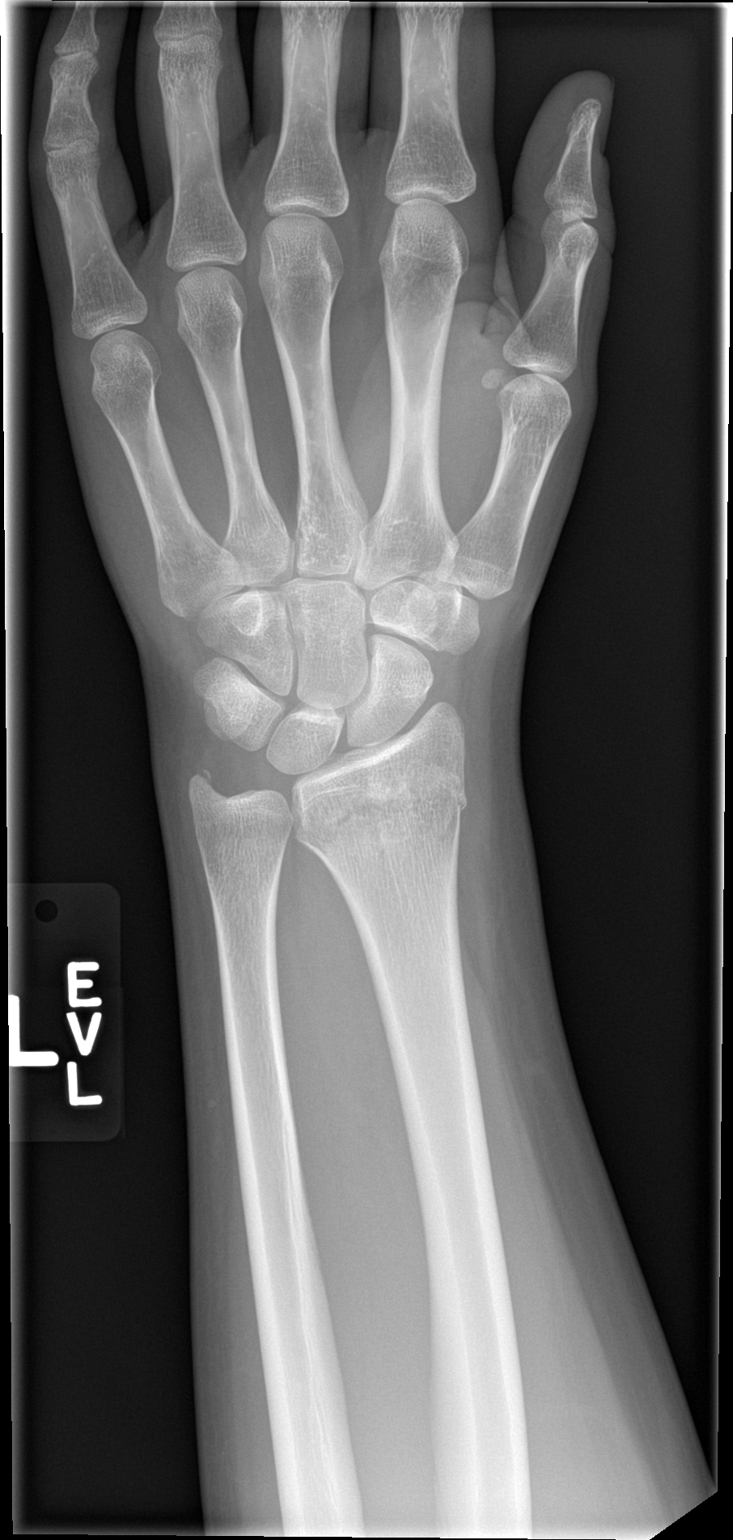

[wrist obl]
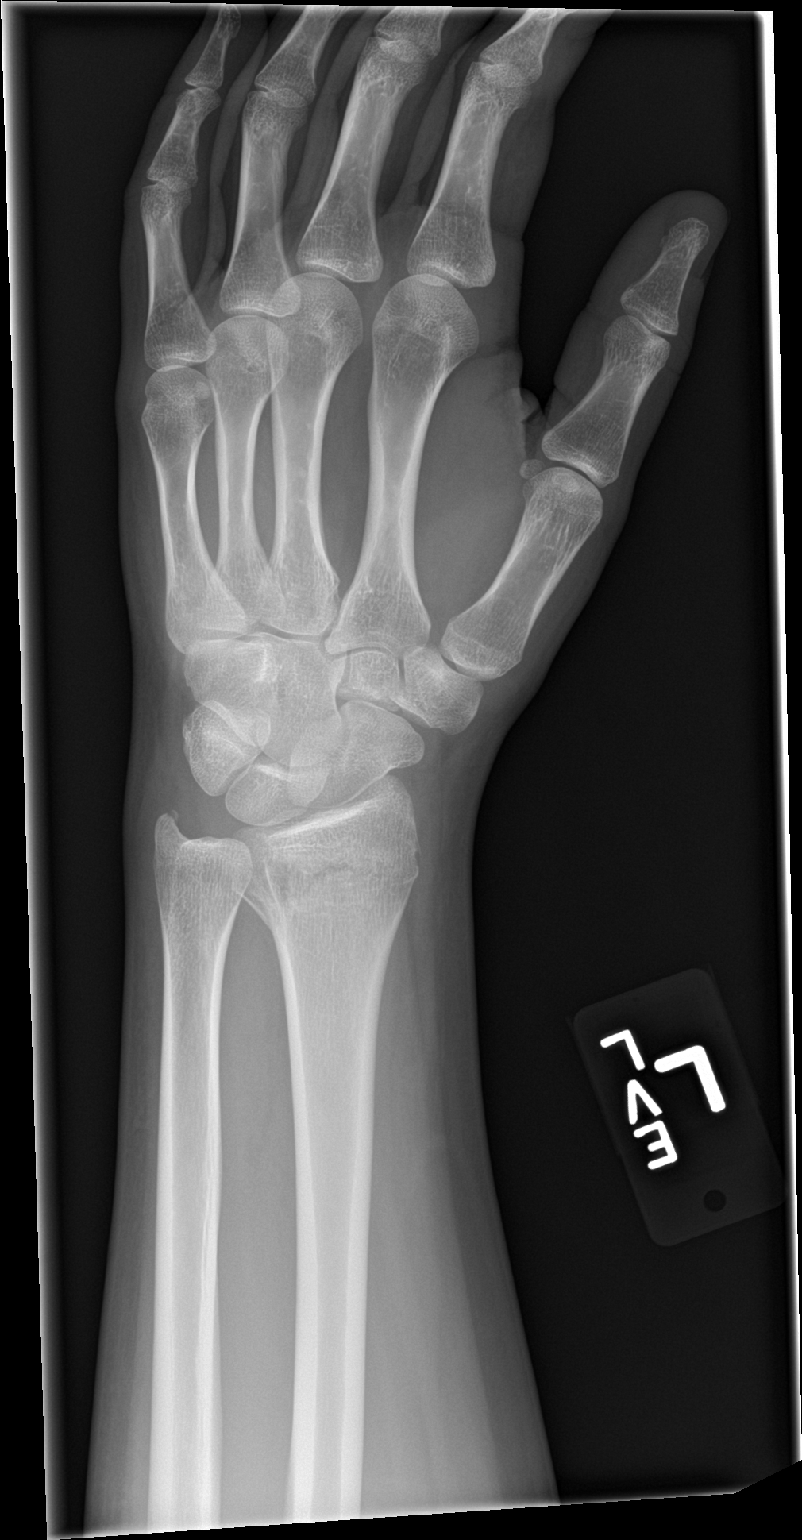

[wrist lat]
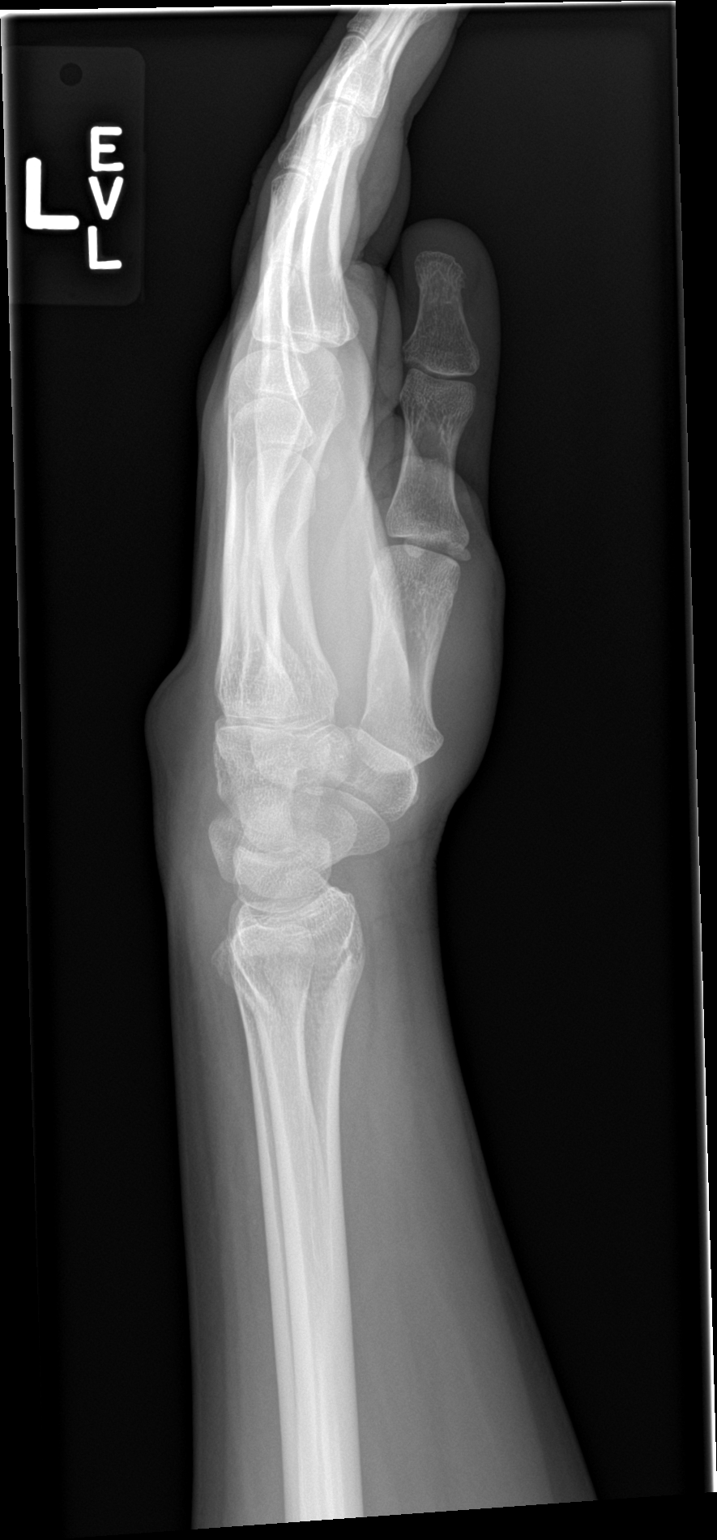

[wrist navicular]
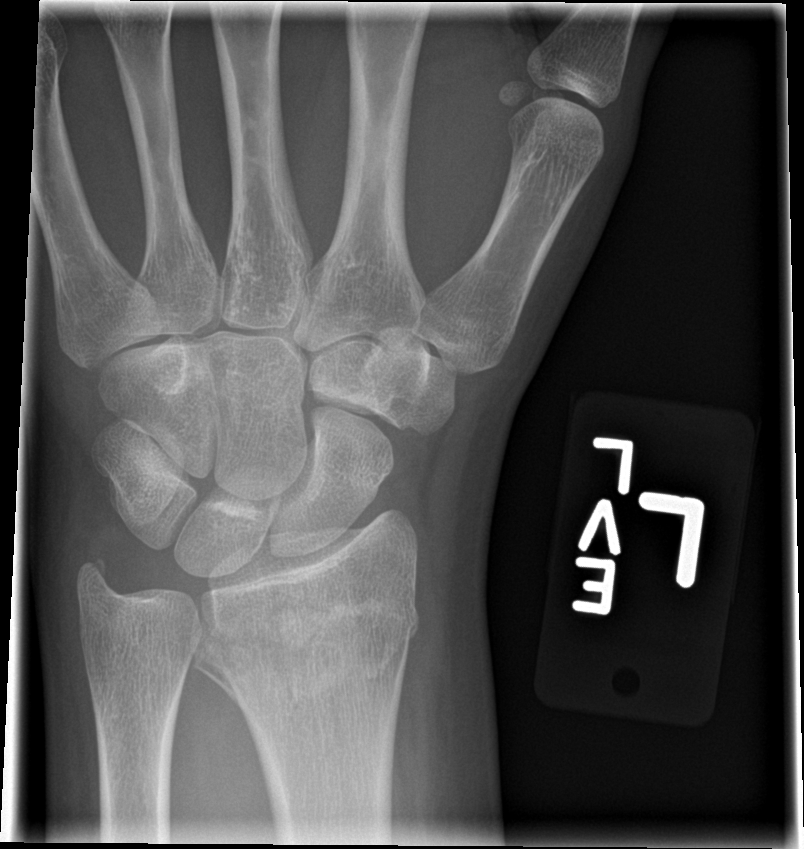

[4 of 4 positions shown; findings below may reference images not displayed]

FINDINGS: There is an impacted appearing transverse fracture of the distal
radius. There is a chip fracture the tip of the ulnar styloid. The
remainder of the visualized osseous structures appear unremarkable.
There is soft tissue swelling of the wrist. A
IMPRESSION: Transverse fracture of the distal radius as well as chip fracture of
the ulna styloid.

## 2017-04-05 IMAGING — CR DG WRIST COMPLETE 3+V*L*
4 series · 4 of 4 positions shown · non-contrast
Comparison: Regional immediate post trauma films January 06, 2015
as well as in plaster views [REDACTED] and [DATE].

CLINICAL DATA: Follow-up of a distal radius and ulnar fractures
sustained on January 06, 2015

EXAM:
LEFT WRIST - COMPLETE 3+ VIEW

[wrist pa]
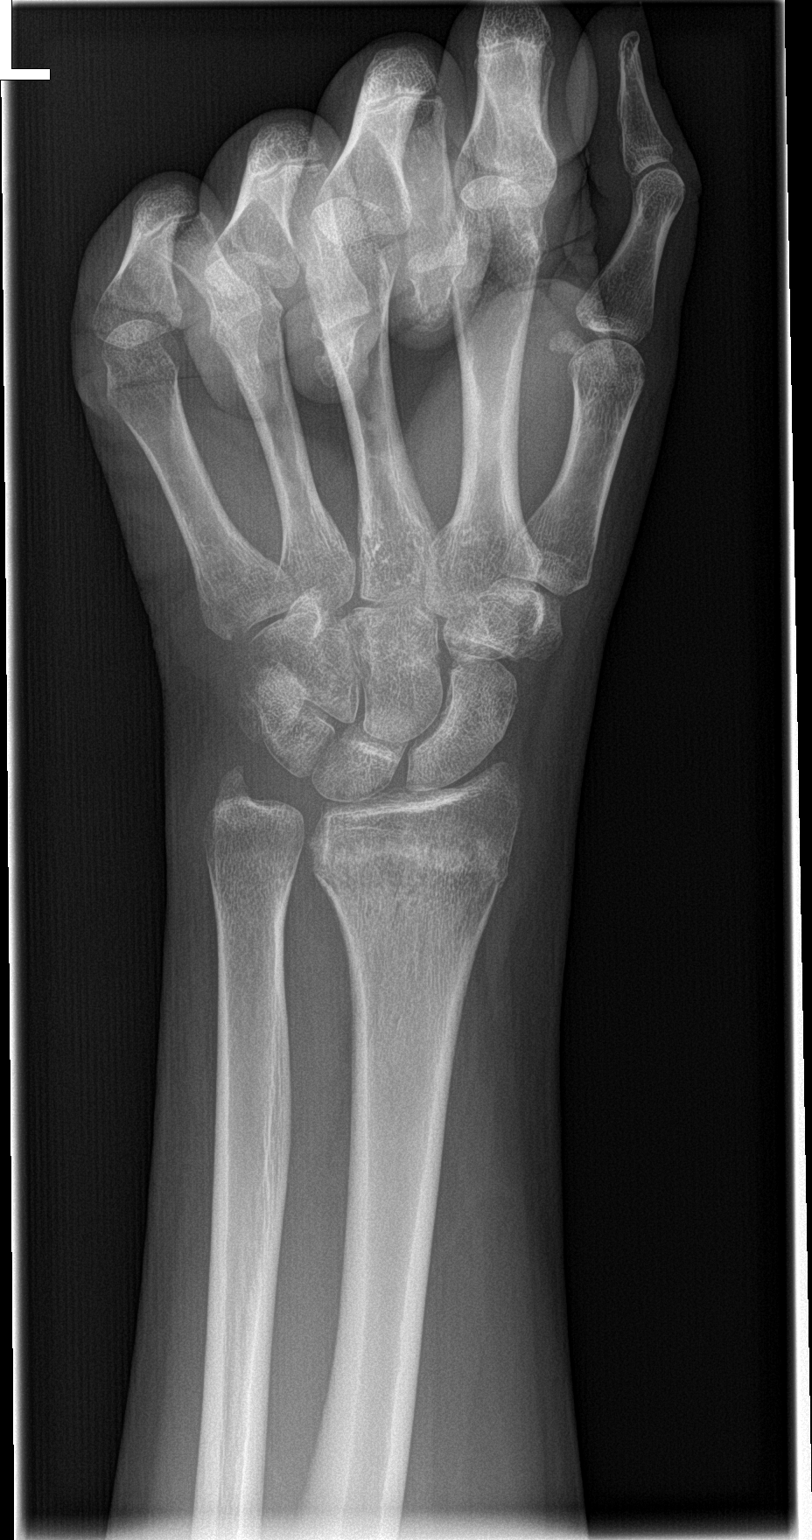

[wrist obl]
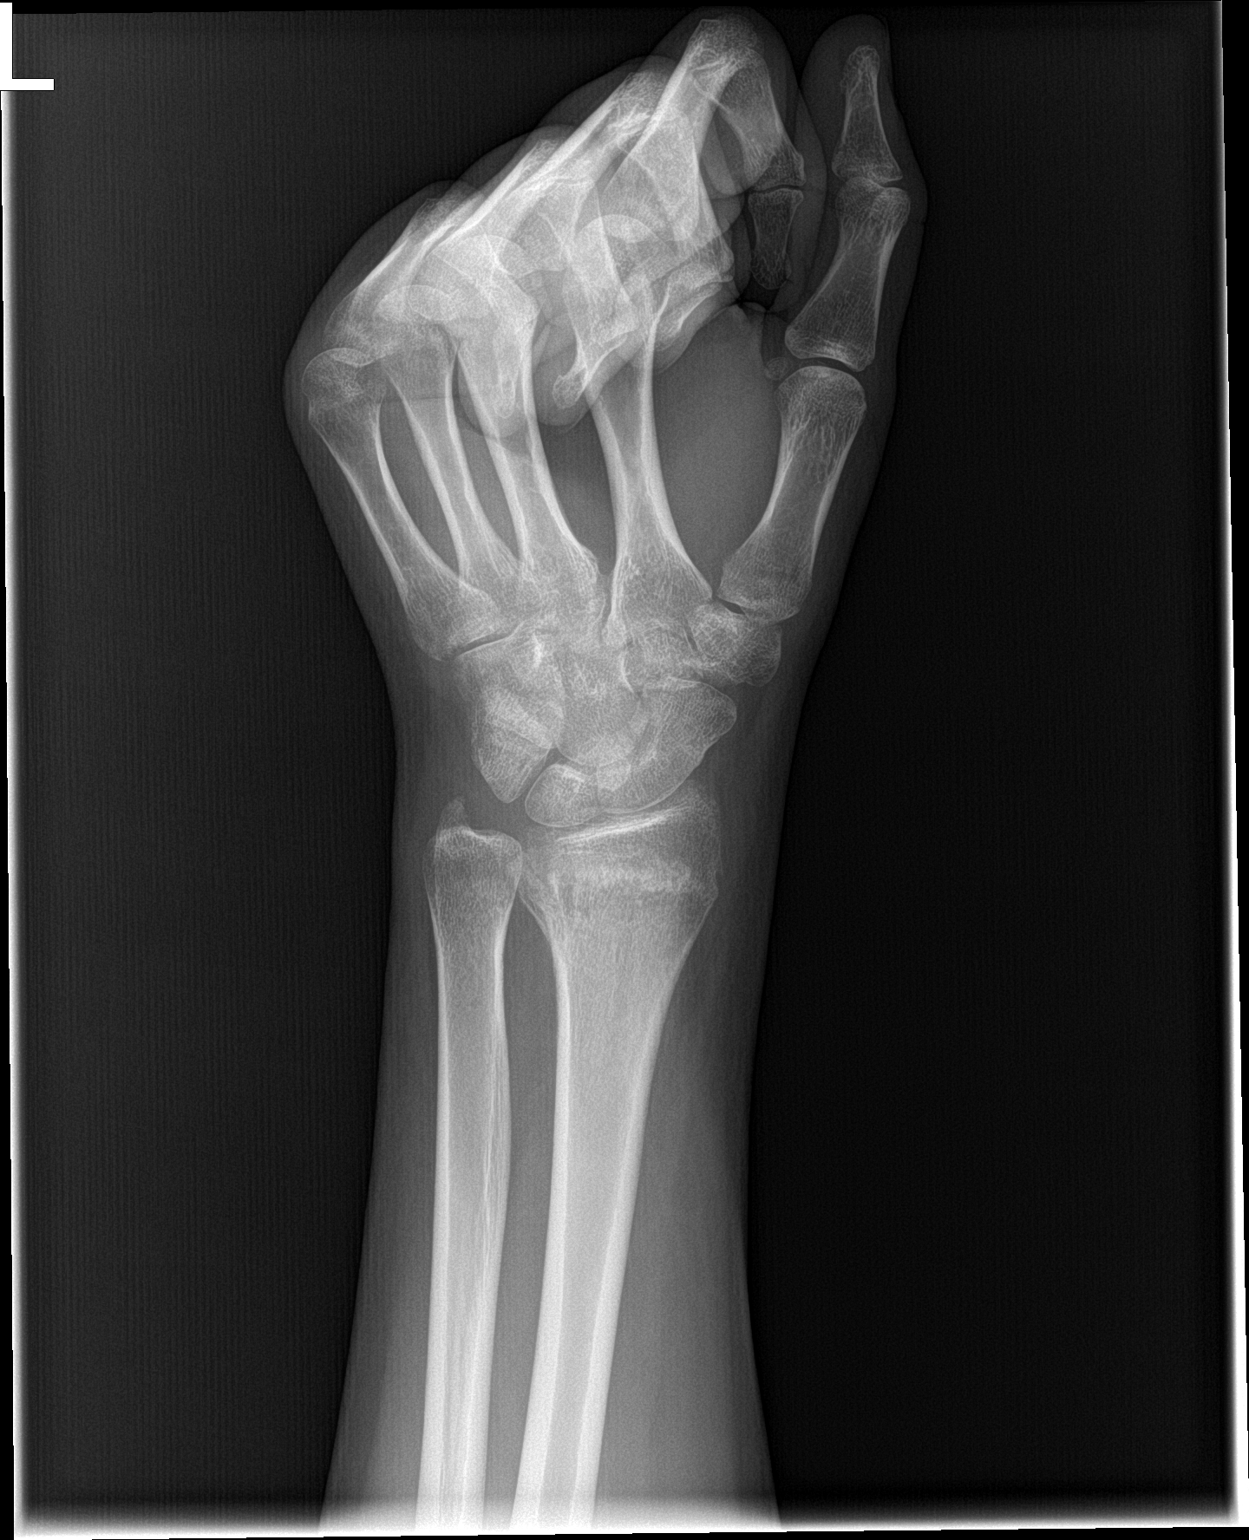

[wrist lat]
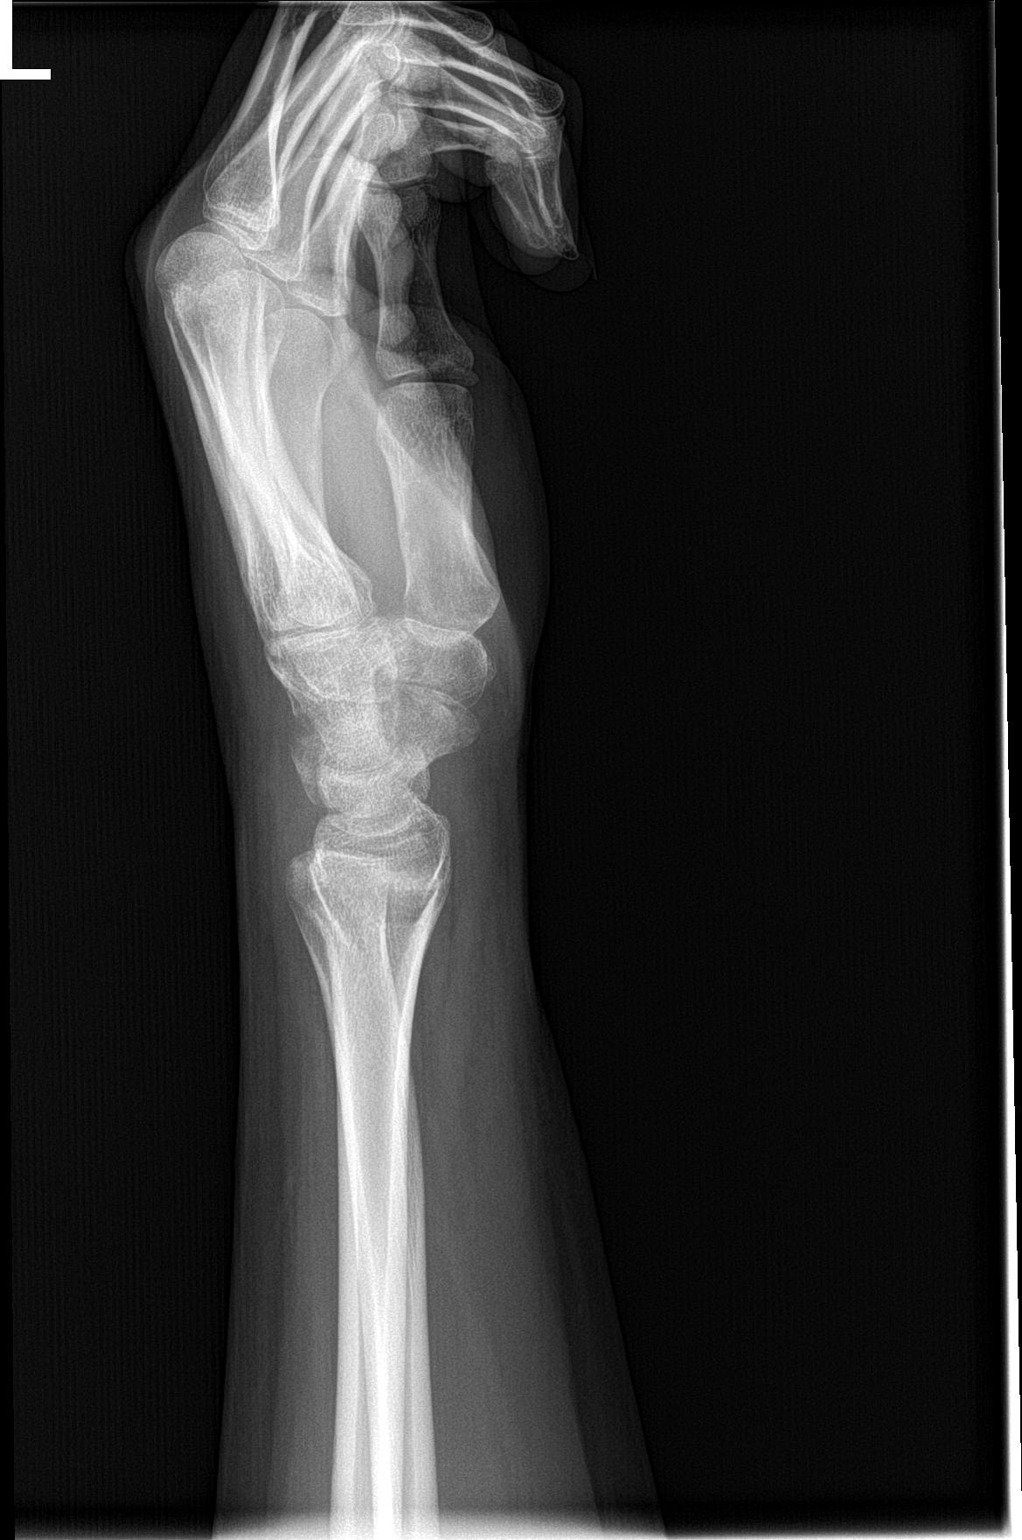

[wrist navicular]
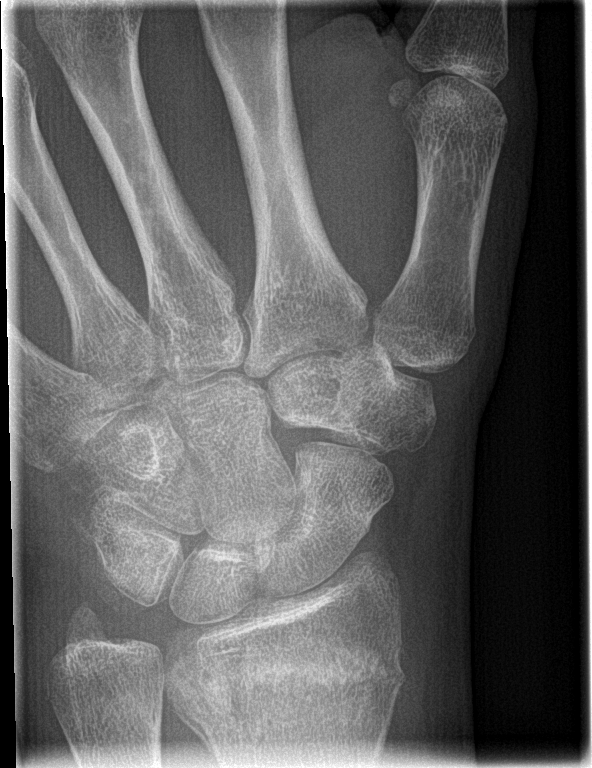

[4 of 4 positions shown; findings below may reference images not displayed]

FINDINGS: The distal radial metaphyseal fracture line has been obliterated it
is now sclerotic in nature. The tiny ulnar styloid avulsion fracture
is nearly imperceptible. The carpal bones are intact. The
metacarpals also appear normal. Mild diffuse osteopenia is suspected
likely secondary to immobilization.
IMPRESSION: Ongoing healing of the distal radial and ulnar fractures without
evidence of complication.

## 2019-03-18 ENCOUNTER — Other Ambulatory Visit: Payer: Self-pay

## 2019-03-18 ENCOUNTER — Encounter: Payer: Self-pay | Admitting: Emergency Medicine

## 2019-03-18 ENCOUNTER — Emergency Department
Admission: EM | Admit: 2019-03-18 | Discharge: 2019-03-18 | Disposition: A | Discharge status: Home / Self Care | Payer: BLUE CROSS/BLUE SHIELD | Source: Home / Self Care

## 2019-03-18 DIAGNOSIS — K029 Dental caries, unspecified: Secondary | ICD-10-CM | POA: Diagnosis not present

## 2019-03-18 DIAGNOSIS — K047 Periapical abscess without sinus: Secondary | ICD-10-CM | POA: Diagnosis not present

## 2019-03-18 MED ORDER — AMOXICILLIN-POT CLAVULANATE 875-125 MG PO TABS: 1.0000 | ORAL_TABLET | Freq: Two times a day (BID) | ORAL | 0 refills | Status: DC

## 2019-03-18 NOTE — ED Triage Notes (Signed)
Patient c/o tooth pain x 2 days, requested antb. No fever, swelling on left side of face.

## 2019-03-18 NOTE — Discharge Instructions (Signed)
Please take antibiotics as prescribed and be sure to complete entire course even if you start to feel better to ensure infection does not come back. ° °Patients with Medicaid: [facility] °[address], [phone] °[address], [phone] ° °If unable to pay, or uninsured, contact [facility] ([phone]) or [facility] ([phone] in [place], [phone] in [place]) to become qualified for the adult dental clinic ° °Other Low-Cost Community Dental Services: °[facility]- [address], [place], [place], [address] °   [phone], Ext. [phone] °   2nd and 4th Thursday of the month at 6:30am °   10 clients each day by appointment, can sometimes see walk-in patients if someone does not show for an appointment °[facility]- [address], [place], [place], [address] °   [phone] °[facility]- [address], [place], [place], [address] °   [phone] ° °[facility]- [phone] °[facility]- [phone] °[facility]- [phone] ° °

## 2019-03-18 NOTE — ED Provider Notes (Signed)
[facility]    CSN: [id number] Arrival date & time: 03/18/19  1519      History   Chief Complaint Chief Complaint  Patient presents with  . Dental Pain    HPI Natalie Kirk is a 23 y.o. female.   HPI  Natalie Kirk is a 23 y.o. female presenting to UC with c/o left lower dental pain and swelling for about 2 days. She knows she has some cavities but has not been to a dentist in a while due to Covid-19 restrictions.  Pt believes he may need antibiotics due to the swelling on her Left lower jaw. Denies fever, chills, n/v/d. Pain is aching and throbbing, 5/10.   Past Medical History:  Diagnosis Date  . Anxiety   . Depression     Patient Active Problem List   Diagnosis Date Noted  . Eczema 01/16/2015  . Closed fracture of distal ends of left radius and ulna 01/07/2015  . Generalized anxiety disorder  01/03/2014  . Major depressive disorder, recurrent episode, moderate (HCC) 01/03/2014    History reviewed. No pertinent surgical history.  OB History   No obstetric history on file.      Home Medications    Prior to Admission medications   Medication Sig Start Date End Date Taking? Authorizing Provider  busPIRone (BUSPAR) 10 MG tablet Take one tablet morning and evening for anxiety. 01/10/14  Yes Mashburn, Rona Ravens, PA-C  gabapentin (NEURONTIN) 100 MG capsule Take 1 capsule (100 mg total) by mouth 3 (three) times daily. 03/04/15  Yes Zamari Vea O, PA-C  ibuprofen (ADVIL,MOTRIN) 600 MG tablet Take 1 tablet (600 mg total) by mouth every 6 (six) hours as needed. 03/04/15  Yes Ayanah Snader, Vangie Bicker, PA-C  oxyCODONE-acetaminophen (PERCOCET/ROXICET) 5-325 MG per tablet Take 1 tablet by mouth every 6 (six) hours as needed for moderate pain or severe pain. 03/04/15  Yes Lurene Shadow, PA-C  TRI-SPRINTEC 0.18/0.215/0.25 MG-35 MCG tablet  01/14/14  Yes [provider]  amoxicillin-clavulanate (AUGMENTIN) 875-125 MG tablet Take 1 tablet by mouth 2 (two) times daily. One po  bid x 7 days 03/18/19   Lurene Shadow, PA-C  valACYclovir (VALTREX) 1000 MG tablet Take 1 tablet (1,000 mg total) by mouth 3 (three) times daily. 03/04/15   Lurene Shadow, PA-C    Family History Family History  Problem Relation Age of Onset  . Anxiety disorder Maternal Aunt   . Depression Maternal Aunt   . Alcohol abuse Cousin   . Drug abuse Cousin   . Hypertension Father   . Hyperlipidemia Father     Social History Social History   Tobacco Use  . Smoking status: Current Every Day Smoker    Packs/day: 0.40    Types: Cigarettes  . Smokeless tobacco: Never Used  Substance Use Topics  . Alcohol use: Yes    Comment: 1 x a month  . Drug use: Yes    Frequency: 3.0 times per week    Types: Marijuana    Comment: at parties usually 2-3 x a month     Allergies   Hydrocodone   Review of Systems Review of Systems  Constitutional: Negative for chills and fever.  HENT: Positive for dental problem and facial swelling. Negative for ear pain and sore throat.      Physical Exam Triage Vital Signs ED Triage Vitals [03/18/19 1616]  Enc Vitals Group     BP 118/84     Pulse Rate (!) 110  Resp      Temp 98.3 F (36.8 C)     Temp Source Oral     SpO2 98 %     Weight 168 lb 4 oz (76.3 kg)     Height 5\' 5"  (1.651 m)     Head Circumference      Peak Flow      Pain Score 5     Pain Loc      Pain Edu?      Excl. in [place]?    No data found.  Updated Vital Signs BP 118/84 (BP Location: Left Arm)   Pulse (!) 110   Temp 98.3 F (36.8 C) (Oral)   Ht 5\' 5"  (1.651 m)   Wt 168 lb 4 oz (76.3 kg)   SpO2 98%   BMI 28.00 kg/m   Visual Acuity Right Eye Distance:   Left Eye Distance:   Bilateral Distance:    Right Eye Near:   Left Eye Near:    Bilateral Near:     Physical Exam Vitals signs and nursing note reviewed.  Constitutional:      Appearance: Normal appearance. She is well-developed.  HENT:     Head: Normocephalic and atraumatic.     Comments: Mild Left lower  facial swelling, tenderness.    Nose: Nose normal.     Mouth/Throat:     Lips: Pink.     Mouth: Mucous membranes are moist.     Dentition: Dental tenderness, dental caries and dental abscesses present.     Pharynx: Oropharynx is clear. Uvula midline.   Neck:     Musculoskeletal: Normal range of motion.  Cardiovascular:     Rate and Rhythm: Regular rhythm. Tachycardia present.  Pulmonary:     Effort: Pulmonary effort is normal.  Musculoskeletal: Normal range of motion.  Lymphadenopathy:     Cervical: Cervical adenopathy (left submandibular) present.  Skin:    General: Skin is warm and dry.  Neurological:     Mental Status: She is alert and oriented to person, place, and time.  Psychiatric:        Behavior: Behavior normal.      UC Treatments / Results  Labs (all labs ordered are listed, but only abnormal results are displayed) Labs Reviewed - No data to display  EKG   Radiology No results found.  Procedures Procedures (including critical care time)  Medications Ordered in UC Medications - No data to display  Initial Impression / Assessment and Plan / UC Course  I have reviewed the triage vital signs and the nursing notes.  Pertinent labs & imaging results that were available during my care of the patient were reviewed by me and considered in my medical decision making (see chart for details).     Hx and exam c/w dental abscess Will start on antibiotics Pt declined prescription pain medication Dental resource guide provided.  Final Clinical Impressions(s) / UC Diagnoses   Final diagnoses:  Infected dental caries  Dental abscess     Discharge Instructions      Please take antibiotics as prescribed and be sure to complete entire course even if you start to feel better to ensure infection does not come back.  Patients with Medicaid: [facility] [phone] [address], [place] [address], [phone]  If unable to pay,  or uninsured, contact [facility] [phone]) or [facility] [phone] in [place], [place] in [facility]) to become qualified for the adult dental clinic  Other [facility] Community [facility] Services: [facility]- [address], [place], [place], [address]    [phone], Ext. [phone]    2nd and 4th Thursday of the month at 6:30am    10 clients each day by appointment, can sometimes see walk-in patients if someone does not show for an appointment [facility]- [address], [place], [place], [address]    [phone] [facility]- [address], [place], [place], [address]    [phone]  [facility]- [phone] [facility]- [phone] [phone]- [phone]     ED Prescriptions    Medication Sig Dispense Auth. Provider   amoxicillin-clavulanate (AUGMENTIN) 875-125 MG tablet Take 1 tablet by mouth 2 (two) times daily. One po bid x 7 days 14 tablet Lurene ShadowPhelps, Tifanie Gardiner O, [place]PA-C     PDMP not reviewed this encounter.   Lurene Shadowhelps, Lakenzie Mcclafferty O, [place]PA-C 03/19/19 1038

## 2020-03-24 ENCOUNTER — Other Ambulatory Visit: Payer: Self-pay

## 2020-03-24 ENCOUNTER — Emergency Department (INDEPENDENT_AMBULATORY_CARE_PROVIDER_SITE_OTHER)
Admission: EM | Admit: 2020-03-24 | Discharge: 2020-03-24 | Disposition: A | Discharge status: Home / Self Care | Payer: 59 | Source: Home / Self Care

## 2020-03-24 DIAGNOSIS — S0501XA Injury of conjunctiva and corneal abrasion without foreign body, right eye, initial encounter: Secondary | ICD-10-CM | POA: Diagnosis not present

## 2020-03-24 MED ORDER — TRAMADOL HCL 50 MG PO TABS
50.0000 mg | ORAL_TABLET | Freq: Four times a day (QID) | ORAL | 0 refills | Status: DC | PRN
Start: 2020-03-24 — End: 2020-03-24

## 2020-03-24 MED ORDER — TRAMADOL HCL 50 MG PO TABS
50.0000 mg | ORAL_TABLET | Freq: Four times a day (QID) | ORAL | 0 refills | Status: DC | PRN
Start: 2020-03-24 — End: 2023-04-05

## 2020-03-24 MED ORDER — GENTAMICIN SULFATE 0.3 % OP SOLN: 2.0000 [drp] | OPHTHALMIC | 0 refills | Status: AC

## 2020-03-24 NOTE — ED Triage Notes (Signed)
Patient presents to Urgent Care with complaints of right eye pain since it got scratched by a cardboard box this afternoon. Patient reports she can hardly open the eye due to pain, can see but it is very blurry.

## 2020-03-24 NOTE — Discharge Instructions (Addendum)
  You have a large corneal abrasion on your Right eye. Be sure to use the first dose of antibiotic drops this evening. Call around to eye specialist provided on resource guide to schedule appointment tomorrow morning for further evaluation and treatment of your eye injury.  Tramadol is strong pain medication. While taking, do not drink alcohol, drive, or perform any other activities that requires focus while taking these medications.

## 2020-03-24 NOTE — ED Provider Notes (Signed)
[facility]    CSN: [id number] Arrival date & time: 03/24/20  1924      History   Chief Complaint Chief Complaint  Patient presents with  . Eye Pain    HPI Natalie Kirk is a 24 y.o. female.   HPI  Natalie Kirk is a 24 y.o. female presenting to UC with c/o sudden onset Right eye pain after she bent down and accidentally hit her Right eye on the corner of a cardboard box.  Pain is burning and sore, photophobia. She took ibuprofen without relief. She does not wear glasses or contacts.  UTD on tetanus.   Past Medical History:  Diagnosis Date  . Anxiety   . Depression     Patient Active Problem List   Diagnosis Date Noted  . Eczema 01/16/2015  . Closed fracture of distal ends of left radius and ulna 01/07/2015  . Generalized anxiety disorder  01/03/2014  . Major depressive disorder, recurrent episode, moderate (HCC) 01/03/2014    History reviewed. No pertinent surgical history.  OB History   No obstetric history on file.      Home Medications    Prior to Admission medications   Medication Sig Start Date End Date Taking? Authorizing Provider  amoxicillin-clavulanate (AUGMENTIN) 875-125 MG tablet Take 1 tablet by mouth 2 (two) times daily. One po bid x 7 days 03/18/19   Lurene Shadow, PA-C  busPIRone (BUSPAR) 10 MG tablet Take one tablet morning and evening for anxiety. 01/10/14   Verne Spurr T, PA-C  gabapentin (NEURONTIN) 100 MG capsule Take 1 capsule (100 mg total) by mouth 3 (three) times daily. 03/04/15   Lurene Shadow, PA-C  gentamicin (GARAMYCIN) 0.3 % ophthalmic solution Place 2 drops into the right eye every 4 (four) hours for 5 days. 03/24/20 03/29/20  Lurene Shadow, PA-C  ibuprofen (ADVIL,MOTRIN) 600 MG tablet Take 1 tablet (600 mg total) by mouth every 6 (six) hours as needed. 03/04/15   Lurene Shadow, PA-C  traMADol (ULTRAM) 50 MG tablet Take 1 tablet (50 mg total) by mouth every 6 (six) hours as needed. 03/24/20   Lurene Shadow, PA-C    TRI-SPRINTEC 0.18/0.215/0.25 MG-35 MCG tablet  01/14/14   [provider]  valACYclovir (VALTREX) 1000 MG tablet Take 1 tablet (1,000 mg total) by mouth 3 (three) times daily. 03/04/15   Lurene Shadow, PA-C    Family History Family History  Problem Relation Age of Onset  . Anxiety disorder Maternal Aunt   . Depression Maternal Aunt   . Alcohol abuse Cousin   . Drug abuse Cousin   . Hypertension Father   . Hyperlipidemia Father     Social History Social History   Tobacco Use  . Smoking status: Current Every Day Smoker    Packs/day: 0.40    Types: Cigarettes  . Smokeless tobacco: Never Used  Substance Use Topics  . Alcohol use: Yes    Comment: 1 x a month  . Drug use: Yes    Frequency: 3.0 times per week    Types: Marijuana    Comment: at parties usually 2-3 x a month     Allergies   Hydrocodone   Review of Systems Review of Systems  HENT: Negative for congestion.   Eyes: Positive for photophobia, pain, redness and visual disturbance (due to pain with opening ). Negative for discharge.  Neurological: Negative for dizziness and headaches.     Physical Exam Triage Vital Signs ED Triage Vitals  Enc  Vitals Group     BP 03/24/20 2101 135/90     Pulse Rate 03/24/20 2101 (S) (!) 111     Resp 03/24/20 2101 16     Temp 03/24/20 2101 97.6 F (36.4 C)     Temp Source 03/24/20 2101 Oral     SpO2 03/24/20 2101 100 %     Weight --      Height --      Head Circumference --      Peak Flow --      Pain Score 03/24/20 2100 9     Pain Loc --      Pain Edu? --      Excl. in GC? --    No data found.  Updated Vital Signs BP 135/90 (BP Location: Right Arm)   Pulse (S) (!) 111 Comment: severe pain, crying  Temp 97.6 F (36.4 C) (Oral)   Resp 16   SpO2 100%   Visual Acuity Right Eye Distance: 20/40 Left Eye Distance: 20/20 Bilateral Distance: 20/20  Right Eye Near:   Left Eye Near:    Bilateral Near:     Physical Exam Vitals and nursing note  reviewed.  Constitutional:      Appearance: Normal appearance. She is well-developed.     Comments: Pt sitting in darkened exam room, appears uncomfortable holding a tissue to Right eye  HENT:     Head: Normocephalic and atraumatic.     Nose: Nose normal.  Eyes:     General: Lids are normal. Lids are everted, no foreign bodies appreciated.     Extraocular Movements: Extraocular movements intact.     Conjunctiva/sclera: Conjunctivae normal.     Pupils: Pupils are equal, round, and reactive to light.      Comments: Large area of fluorescein uptake over Right cornea. No foreign bodies.   Cardiovascular:     Rate and Rhythm: Normal rate.  Pulmonary:     Effort: Pulmonary effort is normal.  Musculoskeletal:        General: Normal range of motion.     Cervical back: Normal range of motion.  Skin:    General: Skin is warm and dry.  Neurological:     Mental Status: She is alert and oriented to person, place, and time.  Psychiatric:        Behavior: Behavior normal.      UC Treatments / Results  Labs (all labs ordered are listed, but only abnormal results are displayed) Labs Reviewed - No data to display  EKG   Radiology No results found.  Procedures Procedures (including critical care time)  Medications Ordered in UC Medications - No data to display  Initial Impression / Assessment and Plan / UC Course  I have reviewed the triage vital signs and the nursing notes.  Pertinent labs & imaging results that were available during my care of the patient were reviewed by me and considered in my medical decision making (see chart for details).    Hx and exam c/w large corneal abrasion Rx: gentamycin ophthalmic drops, small amount of tramadol for pain Advised to schedule f/u with eye specialist in the morning for further evaluation and treatment of large corneal abrasion.  Final Clinical Impressions(s) / UC Diagnoses   Final diagnoses:  Corneal abrasion, right, initial  encounter     Discharge Instructions      You have a large corneal abrasion on your Right eye. Be sure to use the first dose of antibiotic drops this evening.  Call around to eye specialist provided on resource guide to schedule appointment tomorrow morning for further evaluation and treatment of your eye injury.  Tramadol is strong pain medication. While taking, do not drink alcohol, drive, or perform any other activities that requires focus while taking these medications.        ED Prescriptions    Medication Sig Dispense Auth. Provider   gentamicin (GARAMYCIN) 0.3 % ophthalmic solution Place 2 drops into the right eye every 4 (four) hours for 5 days. 5 mL Doroteo Glassman, Darrold Bezek O, PA-C   traMADol (ULTRAM) 50 MG tablet  (Status: Discontinued) Take 1 tablet (50 mg total) by mouth every 6 (six) hours as needed. 8 tablet Doroteo Glassman, Fowler Antos O, PA-C   traMADol (ULTRAM) 50 MG tablet  (Status: Discontinued) Take 1 tablet (50 mg total) by mouth every 6 (six) hours as needed. 8 tablet Doroteo Glassman, Qunicy Higinbotham O, PA-C   traMADol (ULTRAM) 50 MG tablet Take 1 tablet (50 mg total) by mouth every 6 (six) hours as needed. 8 tablet Lurene Shadow, [place]     I have reviewed the PDMP during this encounter.   Lurene Shadow, [place] 03/24/20 2140

## 2023-04-05 ENCOUNTER — Ambulatory Visit
Admission: EM | Admit: 2023-04-05 | Discharge: 2023-04-05 | Disposition: A | Discharge status: Home / Self Care | Payer: Medicaid Other | Attending: Family Medicine | Admitting: Family Medicine

## 2023-04-05 ENCOUNTER — Telehealth: Payer: Self-pay

## 2023-04-05 ENCOUNTER — Other Ambulatory Visit: Payer: Self-pay

## 2023-04-05 DIAGNOSIS — J4521 Mild intermittent asthma with (acute) exacerbation: Secondary | ICD-10-CM | POA: Diagnosis not present

## 2023-04-05 DIAGNOSIS — J45909 Unspecified asthma, uncomplicated: Secondary | ICD-10-CM | POA: Diagnosis not present

## 2023-04-05 DIAGNOSIS — R051 Acute cough: Secondary | ICD-10-CM

## 2023-04-05 DIAGNOSIS — L2082 Flexural eczema: Secondary | ICD-10-CM

## 2023-04-05 MED ORDER — ALBUTEROL SULFATE (2.5 MG/3ML) 0.083% IN NEBU
2.5000 mg | INHALATION_SOLUTION | Freq: Once | RESPIRATORY_TRACT | Status: AC
  Administered 2023-04-05: 2.5 mg via RESPIRATORY_TRACT

## 2023-04-05 MED ORDER — PREDNISONE 50 MG PO TABS: ORAL_TABLET | ORAL | 0 refills | Status: AC

## 2023-04-05 MED ORDER — TRIAMCINOLONE ACETONIDE 0.1 % EX CREA: 1.0000 | TOPICAL_CREAM | Freq: Two times a day (BID) | CUTANEOUS | 0 refills | Status: AC

## 2023-04-05 MED ORDER — ALBUTEROL SULFATE HFA 108 (90 BASE) MCG/ACT IN AERS: 1.0000 | INHALATION_SPRAY | Freq: Four times a day (QID) | RESPIRATORY_TRACT | 0 refills | Status: AC | PRN

## 2023-04-05 MED ORDER — AZITHROMYCIN 250 MG PO TABS: ORAL_TABLET | ORAL | 0 refills | Status: AC

## 2023-04-05 NOTE — Discharge Instructions (Signed)
Make sure that you are drinking lots of water Take the prednisone once a day for 5 days Take the Z-Pak as directed.  2 pills today then 1 a day until gone Use the inhaler every 4 hours as needed See your PCP or return here if you are not improving towards the end of the week

## 2023-04-05 NOTE — ED Provider Notes (Signed)
[facility]    CSN: [id number] Arrival date & time: 04/05/23  1611      History   Chief Complaint Chief Complaint  Patient presents with   Cough    HPI Natalie Kirk is a 27 y.o. female.   HPI  Patient has known asthma and allergies.  She has not had asthma for a long time.  She does not have an inhaler currently.  She states that a week ago she was at vacation.  She had an allergic reaction with itchy watery eyes and runny nose.  Ever since then she has had sinus congestion.  Her eyes are still swollen.  She now has sinus pressure and pain.  Green mucus.  A lot of coughing.  And has developed shortness of breath.  She has forceful coughing spells. Past Medical History:  Diagnosis Date   Anxiety    Depression     Patient Active Problem List   Diagnosis Date Noted   Eczema 01/16/2015   Closed fracture of distal ends of left radius and ulna 01/07/2015   Generalized anxiety disorder  01/03/2014   Major depressive disorder, recurrent episode, moderate (HCC) 01/03/2014    History reviewed. No pertinent surgical history.  OB History   No obstetric history on file.      Home Medications    Prior to Admission medications   Medication Sig Start Date End Date Taking? Authorizing Provider  albuterol (VENTOLIN HFA) 108 (90 Base) MCG/ACT inhaler Inhale 1-2 puffs into the lungs every 6 (six) hours as needed for wheezing or shortness of breath. 04/05/23  Yes Eustace Moore, MD  azithromycin (ZITHROMAX Z-PAK) 250 MG tablet Take 2 pills right away.  After this take 1 pill a day until gone 04/05/23  Yes Eustace Moore, MD  cetirizine (ZYRTEC) 10 MG tablet Take 10 mg by mouth daily.   Yes [provider]  predniSONE (DELTASONE) 50 MG tablet Take once a day for 5 days.  Take with food 04/05/23  Yes Eustace Moore, MD  triamcinolone cream (KENALOG) 0.1 % Apply 1 Application topically 2 (two) times daily. 04/05/23  Yes Eustace Moore, MD    Family  History Family History  Problem Relation Age of Onset   Anxiety disorder Maternal Aunt    Depression Maternal Aunt    Alcohol abuse Cousin    Drug abuse Cousin    Hypertension Father    Hyperlipidemia Father     Social History Social History   Tobacco Use   Smoking status: Every Day    Current packs/day: 0.40    Types: Cigarettes   Smokeless tobacco: Never  Substance Use Topics   Alcohol use: Yes    Comment: 1 x a month   Drug use: Yes    Frequency: 3.0 times per week    Types: Marijuana    Comment: at parties usually 2-3 x a month     Allergies   Hydrocodone   Review of Systems Review of Systems See HPI  Physical Exam    Updated Vital Signs BP 124/74 (BP Location: Left Arm)   Pulse 84   Temp 97.8 F (36.6 C)   Resp 16   SpO2 98%   :     Physical Exam Constitutional:      General: She is not in acute distress.    Appearance: She is well-developed.     Comments: Eyes have conjunctival injection, lids are swollen and crepey  HENT:  Head: Normocephalic and atraumatic.     Right Ear: Tympanic membrane and ear canal normal.     Left Ear: Tympanic membrane and ear canal normal.     Nose: Congestion and rhinorrhea present.     Mouth/Throat:     Mouth: Mucous membranes are moist.     Pharynx: Posterior oropharyngeal erythema present.  Eyes:     Conjunctiva/sclera: Conjunctivae normal.     Pupils: Pupils are equal, round, and reactive to light.  Cardiovascular:     Rate and Rhythm: Normal rate and regular rhythm.     Heart sounds: Normal heart sounds.  Pulmonary:     Effort: Pulmonary effort is normal. No respiratory distress.     Breath sounds: Wheezing present.     Comments: Wheezes inspiratory wheezes throughout Abdominal:     General: There is no distension.     Palpations: Abdomen is soft.  Musculoskeletal:        General: Normal range of motion.     Cervical back: Normal range of motion.  Skin:    General: Skin is warm and dry.   Neurological:     Mental Status: She is alert.   Reexamination of chest after nebulizer treatment reveals scattered wheeze throughout.  No rales or rhonchi   UC Treatments / Results  Labs (all labs ordered are listed, but only abnormal results are displayed) Labs Reviewed - No data to display  EKG   Radiology No results found.  Procedures Procedures (including critical care time)  Medications Ordered in UC Medications  albuterol (PROVENTIL) (2.5 MG/3ML) 0.083% nebulizer solution 2.5 mg (2.5 mg Nebulization Given 04/05/23 1649)    Initial Impression / Assessment and Plan / UC Course  I have reviewed the triage vital signs and the nursing notes.  Pertinent labs & imaging results that were available during my care of the patient were reviewed by me and considered in my medical decision making (see chart for details).    Patient has eczema.  Present on the flexor surface of both arms.  She has run out of her cream.  Will refill triamcinolone Final Clinical Impressions(s) / UC Diagnoses   Final diagnoses:  Mild intermittent asthma with exacerbation  Asthma due to environmental allergies  Acute cough  Flexural eczema     Discharge Instructions      Make sure that you are drinking lots of water Take the prednisone once a day for 5 days Take the Z-Pak as directed.  2 pills today then 1 a day until gone Use the inhaler every 4 hours as needed See your PCP or return here if you are not improving towards the end of the week     ED Prescriptions     Medication Sig Dispense Auth. Provider   albuterol (VENTOLIN HFA) 108 (90 Base) MCG/ACT inhaler Inhale 1-2 puffs into the lungs every 6 (six) hours as needed for wheezing or shortness of breath. 18 g Eustace Moore, MD   azithromycin (ZITHROMAX Z-PAK) 250 MG tablet Take 2 pills right away.  After this take 1 pill a day until gone 6 tablet Eustace Moore, MD   predniSONE (DELTASONE) 50 MG tablet Take once a day for 5  days.  Take with food 5 tablet Eustace Moore, MD   triamcinolone cream (KENALOG) 0.1 % Apply 1 Application topically 2 (two) times daily. 30 g Eustace Moore, MD      PDMP not reviewed this encounter.   Eustace Moore, MD 04/05/23  1727  

## 2023-04-05 NOTE — ED Triage Notes (Signed)
Reports had an allergic reaction at beach, now has cough which is productive of yellow sputum. Reports did have nasal congestion but that has gone away. No fevers.
